# Patient Record
Sex: Male | Born: 2000 | Race: Black or African American | Hispanic: No | Marital: Single | Smoking: Never smoker
Health system: Southern US, Community
[De-identification: ages and names within clinical notes are randomized; demographics above are authoritative.]

## PROBLEM LIST (undated history)

## (undated) DIAGNOSIS — L309 Dermatitis, unspecified: Secondary | ICD-10-CM

---
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
## 2020-01-03 ENCOUNTER — Emergency Department (HOSPITAL_BASED_OUTPATIENT_CLINIC_OR_DEPARTMENT_OTHER): Payer: Medicaid Other

## 2020-01-03 ENCOUNTER — Encounter (HOSPITAL_BASED_OUTPATIENT_CLINIC_OR_DEPARTMENT_OTHER): Payer: Self-pay | Admitting: Emergency Medicine

## 2020-01-03 ENCOUNTER — Emergency Department (HOSPITAL_BASED_OUTPATIENT_CLINIC_OR_DEPARTMENT_OTHER)
Admission: EM | Admit: 2020-01-03 | Discharge: 2020-01-03 | Disposition: A | Discharge status: Home / Self Care | Payer: Medicaid Other | Attending: Emergency Medicine | Admitting: Emergency Medicine

## 2020-01-03 DIAGNOSIS — R0789 Other chest pain: Secondary | ICD-10-CM | POA: Insufficient documentation

## 2020-01-03 DIAGNOSIS — R079 Chest pain, unspecified: Secondary | ICD-10-CM

## 2020-01-03 LAB — COMPREHENSIVE METABOLIC PANEL
ALT: 11 U/L (ref 0–44)
AST: 14 U/L — ABNORMAL LOW (ref 15–41)
Albumin: 4.7 g/dL (ref 3.5–5.0)
Alkaline Phosphatase: 78 U/L (ref 38–126)
Anion gap: 9 (ref 5–15)
BUN: 19 mg/dL (ref 6–20)
CO2: 25 mmol/L (ref 22–32)
Calcium: 9.6 mg/dL (ref 8.9–10.3)
Chloride: 101 mmol/L (ref 98–111)
Creatinine, Ser: 0.74 mg/dL (ref 0.61–1.24)
GFR, Estimated: >60 mL/min (ref >60)
Glucose, Bld: 89 mg/dL (ref 70–99)
Potassium: 3.5 mmol/L (ref 3.5–5.1)
Sodium: 135 mmol/L (ref 135–145)
Total Bilirubin: 0.8 mg/dL (ref 0.3–1.2)
Total Protein: 7.9 g/dL (ref 6.5–8.1)

## 2020-01-03 LAB — CBC
HCT: 45 % (ref 39.0–52.0)
Hemoglobin: 15.6 g/dL (ref 13.0–17.0)
MCH: 31.3 pg (ref 26.0–34.0)
MCHC: 34.7 g/dL (ref 30.0–36.0)
MCV: 90.2 fL (ref 80.0–100.0)
Platelets: 221 10*3/uL (ref 150–400)
RBC: 4.99 MIL/uL (ref 4.22–5.81)
RDW: 12.6 % (ref 11.5–15.5)
WBC: 5 10*3/uL (ref 4.0–10.5)
nRBC: 0 % (ref 0.0–0.2)

## 2020-01-03 LAB — LIPASE, BLOOD: Lipase: 20 U/L (ref 11–51)

## 2020-01-03 LAB — D-DIMER, QUANTITATIVE: D-Dimer, Quant: <0.27 ug/mL-FEU (ref 0.00–0.50)

## 2020-01-03 LAB — TROPONIN I (HIGH SENSITIVITY): Troponin I (High Sensitivity): <2 ng/L (ref <18)

## 2020-01-03 LAB — CBG MONITORING, ED: Glucose-Capillary: 83 mg/dL (ref 70–99)

## 2020-01-03 MED ORDER — IOHEXOL 350 MG/ML SOLN
100.0000 mL | Freq: Once | INTRAVENOUS | Status: AC | PRN
  Administered 2020-01-03: 12:00:00 100 mL via INTRAVENOUS

## 2020-01-03 MED ORDER — NAPROXEN 375 MG PO TABS: 375.0000 mg | ORAL_TABLET | Freq: Two times a day (BID) | ORAL | 0 refills | Status: AC

## 2020-01-03 MED ORDER — MORPHINE SULFATE (PF) 4 MG/ML IV SOLN
4.0000 mg | Freq: Once | INTRAVENOUS | Status: AC
  Administered 2020-01-03: 10:00:00 4 mg via INTRAVENOUS
  Filled 2020-01-03: qty 1

## 2020-01-03 NOTE — ED Provider Notes (Signed)
[facility] EMERGENCY DEPARTMENT Provider Note   CSN: [id number] Arrival date & time: 01/03/20  3419     History Chief Complaint  Patient presents with  . Chest Pain    Deray Dawes is a 19 y.o. male.  HPI  HPI: A 19 year old patient presents for evaluation of chest pain. Initial onset of pain was more than 6 hours ago. The patient's chest pain is well-localized, is sharp and is not worse with exertion. The patient's chest pain is middle- or left-sided, is not described as heaviness/pressure/tightness and does not radiate to the arms/jaw/neck. The patient does not complain of nausea and denies diaphoresis. The patient has no history of stroke, has no history of peripheral artery disease, has not smoked in the past 90 days, denies any history of treated diabetes, has no relevant family history of coronary artery disease (first degree relative at less than age 12), is not hypertensive, has no history of hypercholesterolemia and does not have an elevated BMI (>=30).   Patient states symptoms have been ongoing for the past week.  He was seen at [facility] for the same issue couple days ago.  Patient states he was not given any prescriptions and was instructed to follow-up with his primary care doctor.  He was told that everything checked out okay.  Pain has persisted.  He came to the ED because he is not feeling any better.  Past Medical History:  Diagnosis Date  . Eczema     There are no problems to display for this patient.   History reviewed. No pertinent surgical history.     No family history on file.  Social History   Tobacco Use  . Smoking status: Never Smoker  . Smokeless tobacco: Never Used  Substance Use Topics  . Alcohol use: No  . Drug use: No    Home Medications Prior to Admission medications   Medication Sig Start Date End Date Taking? Authorizing Provider  naproxen (NAPROSYN) 375 MG tablet Take 1 tablet (375 mg total) by mouth 2 (two)  times daily. 01/03/20   Linwood Dibbles, MD  pimecrolimus (ELIDEL) 1 % cream Apply topically 2 (two) times daily.    [provider]    Allergies    Patient has no known allergies.  Review of Systems   Review of Systems  All other systems reviewed and are negative.   Physical Exam Updated Vital Signs BP 111/65   Pulse 76   Temp 98.3 F (36.8 C) (Oral)   Resp 17   Ht 1.778 m (5\' 10" )   Wt 59 kg   SpO2 100%   BMI 18.65 kg/m   Physical Exam Vitals and nursing note reviewed.  Constitutional:      General: He is not in acute distress.    Appearance: He is well-developed and well-nourished.     Comments: Thin, appears to be in pain  HENT:     Head: Normocephalic and atraumatic.     Right Ear: External ear normal.     Left Ear: External ear normal.  Eyes:     General: No scleral icterus.       Right eye: No discharge.        Left eye: No discharge.     Conjunctiva/sclera: Conjunctivae normal.  Neck:     Trachea: No tracheal deviation.  Cardiovascular:     Rate and Rhythm: Normal rate and regular rhythm.     Pulses: Intact distal pulses.  Pulmonary:  Effort: Pulmonary effort is normal. No respiratory distress.     Breath sounds: Normal breath sounds. No stridor. No wheezing or rales.  Abdominal:     General: Bowel sounds are normal. There is no distension.     Palpations: Abdomen is soft.     Tenderness: There is no abdominal tenderness. There is no guarding or rebound.  Musculoskeletal:        General: No tenderness or edema.     Cervical back: Neck supple.  Skin:    General: Skin is warm and dry.     Findings: No rash.  Neurological:     Mental Status: He is alert.     Cranial Nerves: No cranial nerve deficit (no facial droop, extraocular movements intact, no slurred speech).     Sensory: No sensory deficit.     Motor: No abnormal muscle tone or seizure activity.     Coordination: Coordination normal.     Deep Tendon Reflexes: Strength normal.   Psychiatric:        Mood and Affect: Mood and affect normal.     ED Results / Procedures / Treatments   Labs (all labs ordered are listed, but only abnormal results are displayed) Labs Reviewed  COMPREHENSIVE METABOLIC PANEL - Abnormal; Notable for the following components:      Result Value   AST 14 (*)    All other components within normal limits  CBC  D-DIMER, QUANTITATIVE (NOT AT [facility])  LIPASE, BLOOD  CBG MONITORING, ED  TROPONIN I (HIGH SENSITIVITY)    EKG EKG Interpretation  Date/Time:  Saturday January 03 2020 09:23:21 EST Ventricular Rate:  82 PR Interval:    QRS Duration: 88 QT Interval:  339 QTC Calculation: 396 R Axis:   48 Text Interpretation: Sinus rhythm Borderline low voltage, extremity leads Nonspecific T abnormalities, lateral leads No old tracing to compare Confirmed by Linwood Dibbles [phone]) on 01/03/2020 9:26:53 AM Also confirmed by Linwood Dibbles [phone]), editor Elita Quick [phone])  on 01/03/2020 12:35:06 PM   Radiology DG Chest Portable 1 View  Result Date: 01/03/2020 CLINICAL DATA:  19 year old male with chest pain. EXAM: PORTABLE CHEST 1 VIEW COMPARISON:  12/30/2019 FINDINGS: The heart size and mediastinal contours are within normal limits. Both lungs are clear. The visualized skeletal structures are unremarkable. IMPRESSION: No acute cardiopulmonary process. Electronically Signed   By: Marliss Coots MD   On: 01/03/2020 10:34   CT ANGIO CHEST AORTA W/CM & OR WO/CM  Result Date: 01/03/2020 CLINICAL DATA:  19 year old male with history of left-sided chest pain for 1 week. Evaluate for nontraumatic aortic disease. EXAM: CT ANGIOGRAPHY CHEST WITH CONTRAST TECHNIQUE: Multidetector CT imaging of the chest was performed using the standard protocol during bolus administration of intravenous contrast. Multiplanar CT image reconstructions and MIPs were obtained to evaluate the vascular anatomy. CONTRAST:  OMNIPAQUE IOHEXOL 350 MG/ML SOLN COMPARISON:   None. FINDINGS: Cardiovascular: Heart size is mildly enlarged. There is no significant pericardial fluid, thickening or pericardial calcification. No crescentic high attenuation associated with the wall of the thoracic aorta on noncontrast images to suggest acute intramural hemorrhage. No significant atherosclerotic disease, aneurysm or dissection noted in the thoracic aorta. No calcifications in the coronary arteries. Mediastinum/Nodes: No pathologically enlarged mediastinal or hilar lymph nodes. Esophagus is unremarkable in appearance. No axillary lymphadenopathy. Lungs/Pleura: No pneumothorax. No acute consolidative airspace disease. No pleural effusions. No suspicious appearing pulmonary nodules or masses are noted. Upper Abdomen: Unremarkable. Musculoskeletal: There are no aggressive appearing lytic or blastic  lesions noted in the visualized portions of the skeleton. Review of the MIP images confirms the above findings. IMPRESSION: 1. No acute findings are noted in the thorax. Specifically, no evidence of acute aortic syndrome. Electronically Signed   By: Trudie Reed M.D.   On: 01/03/2020 12:28    Procedures Procedures (including critical care time)  Medications Ordered in ED Medications  morphine 4 MG/ML injection 4 mg (4 mg Intravenous Given 01/03/20 1022)  iohexol (OMNIPAQUE) 350 MG/ML injection 100 mL (100 mLs Intravenous Contrast Given 01/03/20 1157)    ED Course  I have reviewed the triage vital signs and the nursing notes.  Pertinent labs & imaging results that were available during my care of the patient were reviewed by me and considered in my medical decision making (see chart for details).  Clinical Course as of 01/03/20 1307  Sat Jan 03, 2020  0926 EKG 12-Lead [JK]  1118 Lab tests reviewed.  D-dimer troponin lipase CBC metabolic panel all normal [JK]  1119 Chest x-ray without acute findings [JK]  1130 Discussed test results with patient and mother.  I am concerned about  the possibility of aortic dissection primarily based on his persistent symptoms and morphology.  No known history of Marfan's disease but patient is very tall and thin.  We will proceed with CT angio [JK]    Clinical Course User Index [JK] Linwood Dibbles, MD   MDM Rules/Calculators/A&P HEAR Score: 1                        Patient presented to the ED for evaluation of chest pain.  Patient has been having symptoms for the past week.  Mom states he did have some improvement although it did not resolve after the ED visit the other day.  Pain however persisted and that is why he returned here.  Patient's ED work-up is reassuring.  Cardiac enzymes are normal.  His low risk for heart disease.  I doubt ACS.  Patient's D-dimer is negative.  I doubt PE.  I was concerned about the possibility of aortic dissection based on the persistent pain in the patient's physical appearance.  CT scan does not show any acute findings.  Not clearly having symptoms suggestive of esophageal etiology.  Symptoms could be related possibly to pleurisy or pericarditis.  Will discharge home with NSAIDs.  Patient did have an echocardiogram this morning although I do not have the results yet.  I think is reasonable for the patient to follow-up with his primary care doctor for further treatment and evaluation. Final Clinical Impression(s) / ED Diagnoses Final diagnoses:  Chest pain, unspecified type    Rx / DC Orders ED Discharge Orders         Ordered    naproxen (NAPROSYN) 375 MG tablet  2 times daily        01/03/20 1305           Linwood Dibbles, MD 01/03/20 1308

## 2020-01-03 NOTE — Discharge Instructions (Signed)
Take medications as needed for pain.  Follow-up with your doctor for further evaluation as we discussed

## 2020-01-03 NOTE — ED Notes (Signed)
CBG 83 

## 2020-01-03 NOTE — ED Notes (Signed)
ED Provider at bedside. 

## 2020-01-03 NOTE — ED Triage Notes (Signed)
L side chest pain x 1 week. Was seen at [facility] this week. Pain persists. Denies recent illness.

## 2020-01-03 NOTE — ED Notes (Signed)
PCXR at bedside.

## 2021-12-14 IMAGING — DX DG CHEST 1V PORT
1 series · 1 of 1 positions shown · non-contrast
Comparison: 12/30/2019

CLINICAL DATA: 19-year-old male with chest pain.

EXAM:
PORTABLE CHEST 1 VIEW

[chest ap]
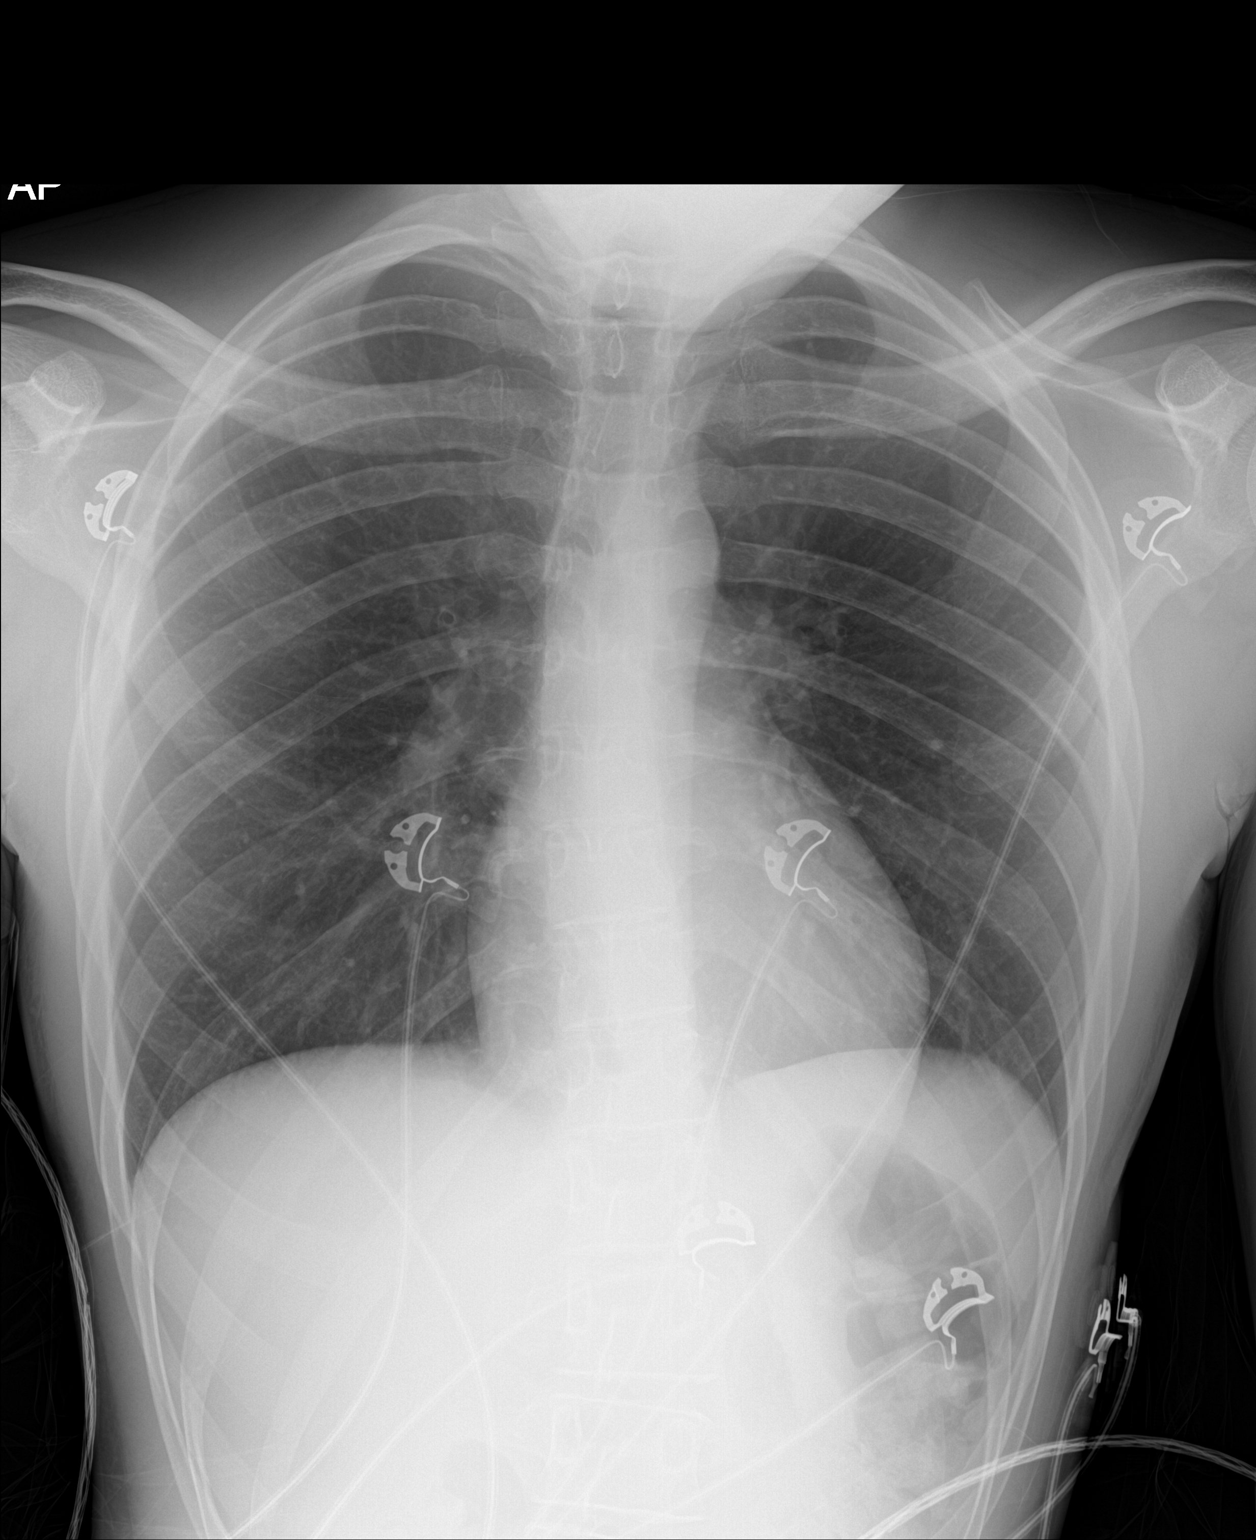

[1 of 1 positions shown; findings below may reference images not displayed]

FINDINGS: The heart size and mediastinal contours are within normal limits.
Both lungs are clear. The visualized skeletal structures are
unremarkable.
IMPRESSION: No acute cardiopulmonary process.

## 2021-12-14 IMAGING — CT CT ANGIO CHEST
3 of 9 series · 18 of 46 positions shown · IV contrast (Omnipaque)
Comparison: None.

CLINICAL DATA: 19-year-old male with history of left-sided chest
pain for 1 week. Evaluate for nontraumatic aortic disease.

EXAM:
CT ANGIOGRAPHY CHEST WITH CONTRAST
TECHNIQUE: Multidetector CT imaging of the chest was performed using the
standard protocol during bolus administration of intravenous
contrast. Multiplanar CT image reconstructions and MIPs were
obtained to evaluate the vascular anatomy.
CONTRAST:  100mL OMNIPAQUE IOHEXOL 350 MG/ML SOLN

[Series 5: axial arterial · axial · arterial · 0.68mm/px · z∈[-99,+144]mm · 12 of 97 slices shown]
[im 8/97  lung]
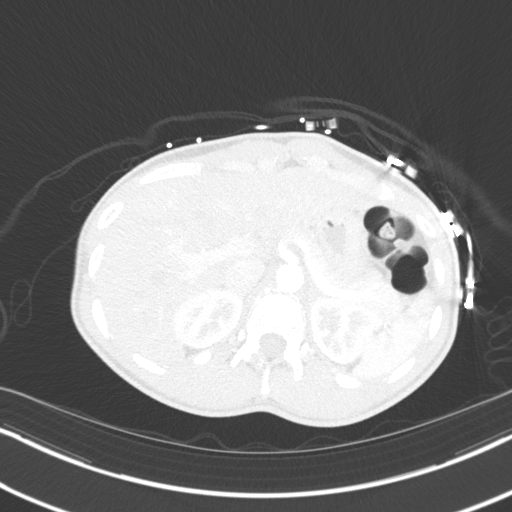
[im 15/97  soft-tissue]
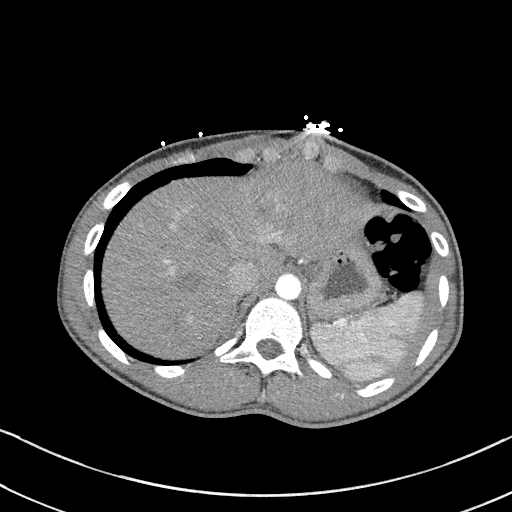
[im 23/97  lung]
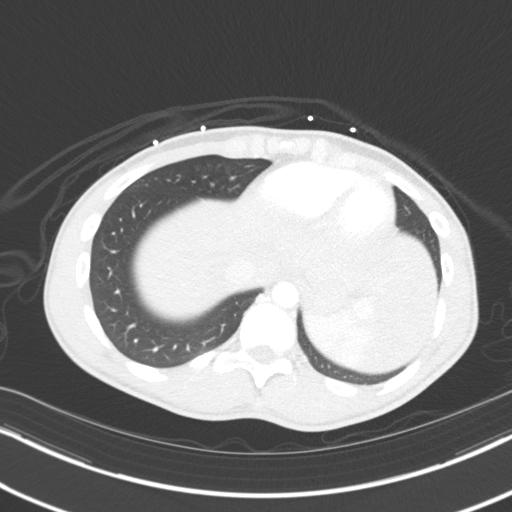
[im 30/97  soft-tissue]
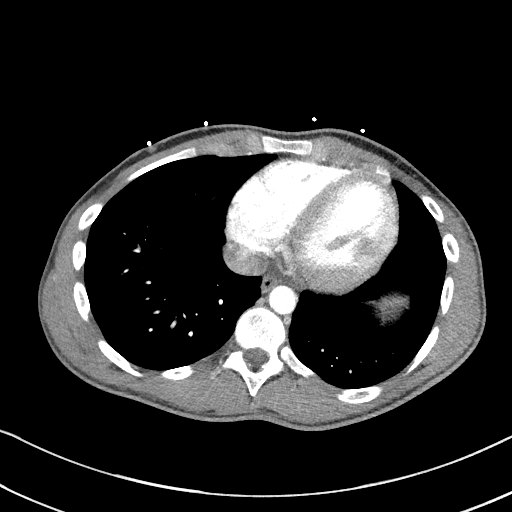
[im 37/97  lung]
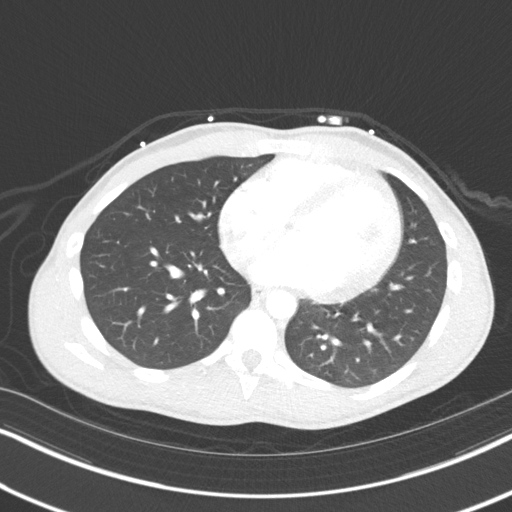
[im 45/97  soft-tissue]
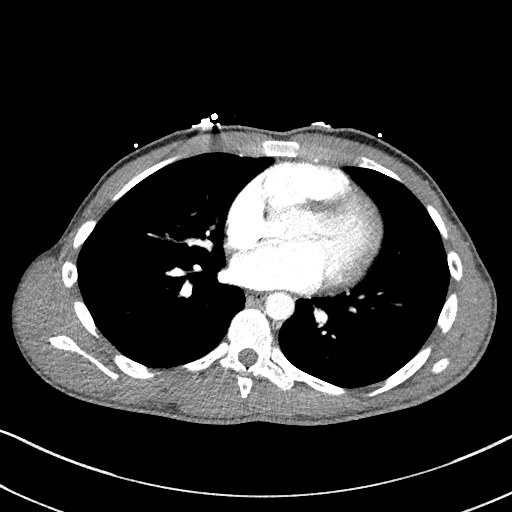
[im 52/97  lung]
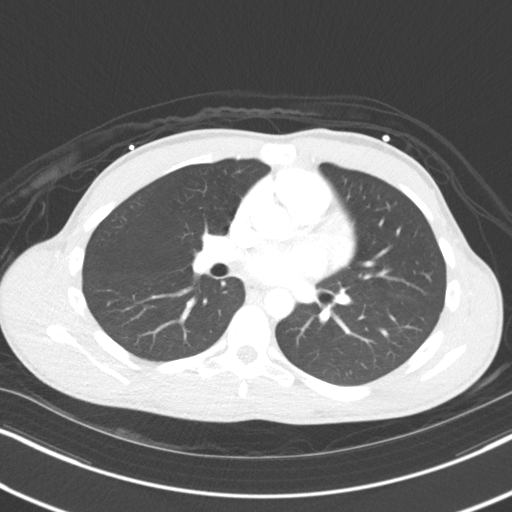
[im 60/97  soft-tissue]
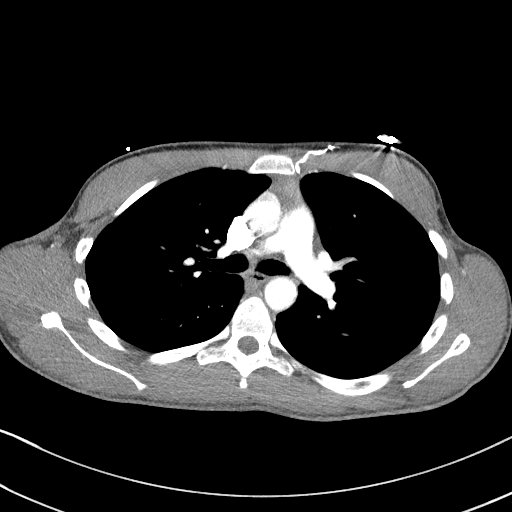
[im 67/97  lung]
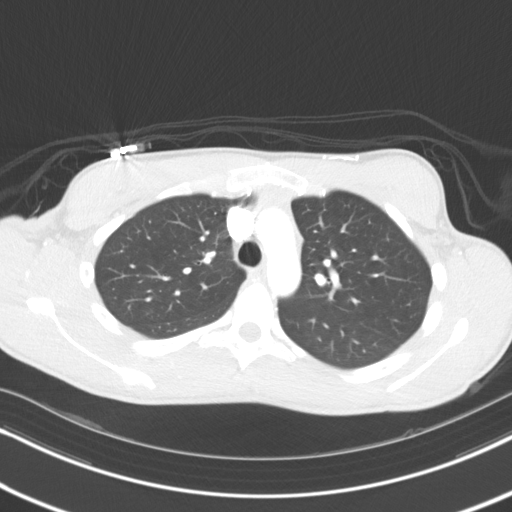
[im 74/97  soft-tissue]
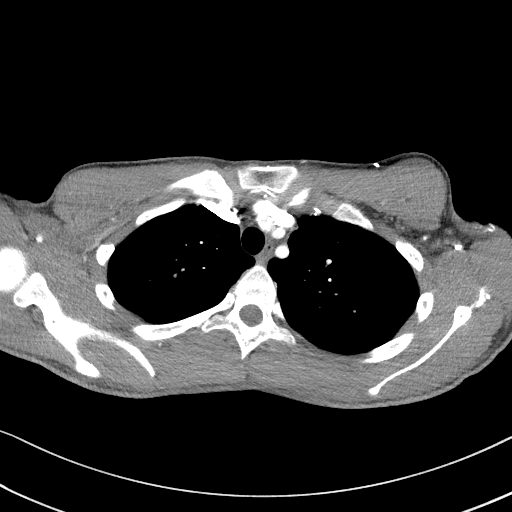
[im 82/97  lung]
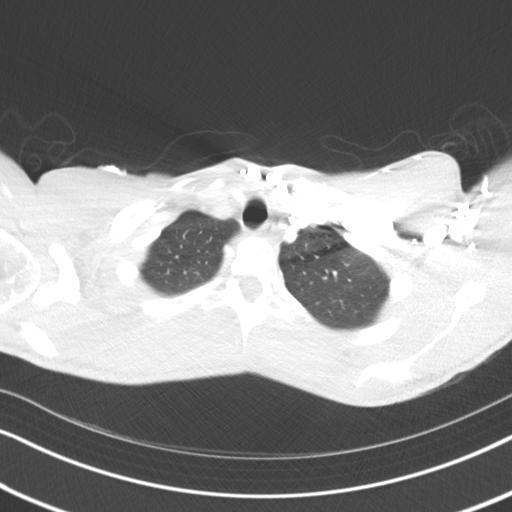
[im 89/97  soft-tissue]
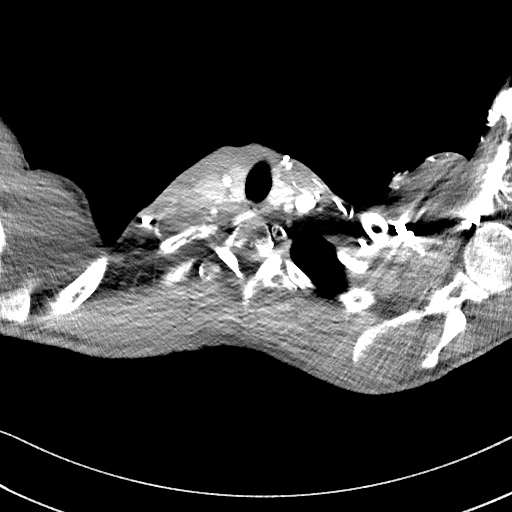

[Series 6: lung · axial · 0.68mm/px · z∈[-82,-2]mm · 3 of 59 slices shown]
[im 9/59  soft-tissue]
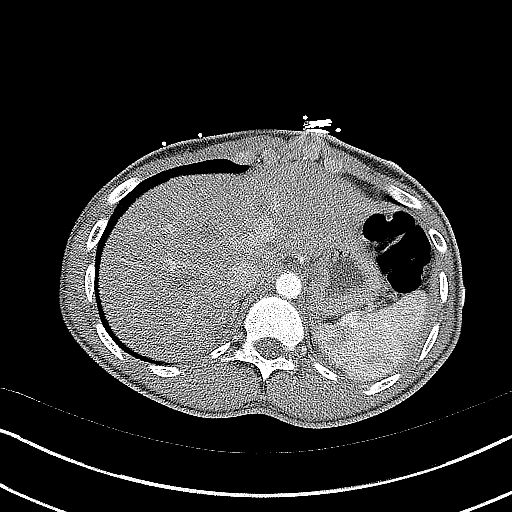
[im 17/59  soft-tissue]
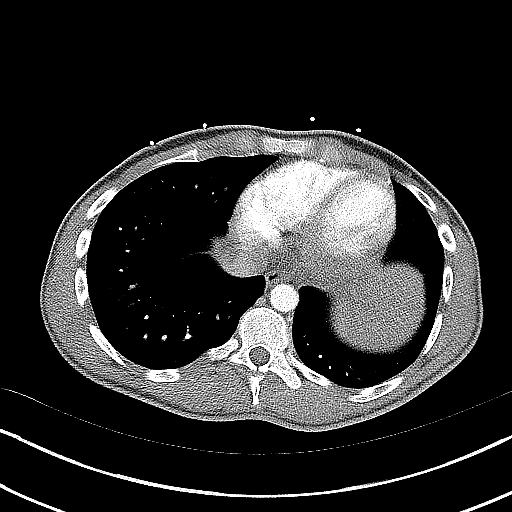
[im 25/59  soft-tissue]
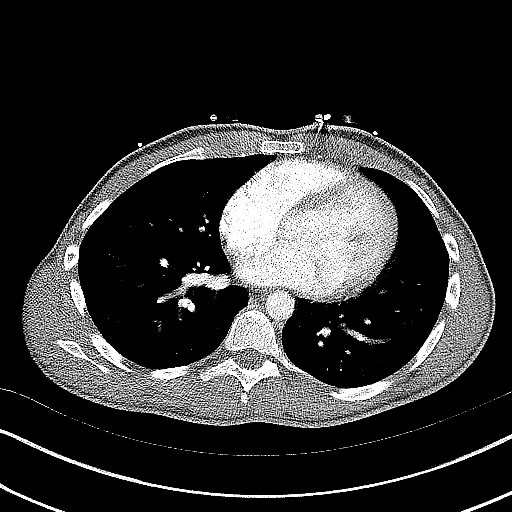

[Series 8: coronals · coronal · 0.59mm/px · 3 of 109 slices shown]
[im 28/109  soft-tissue]
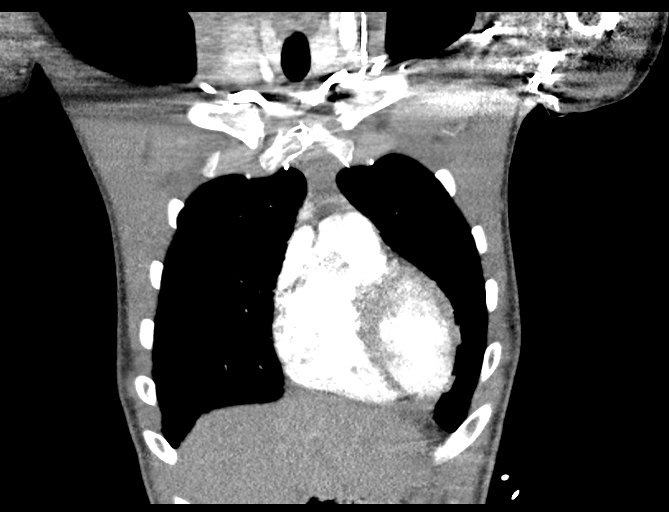
[im 55/109  soft-tissue]
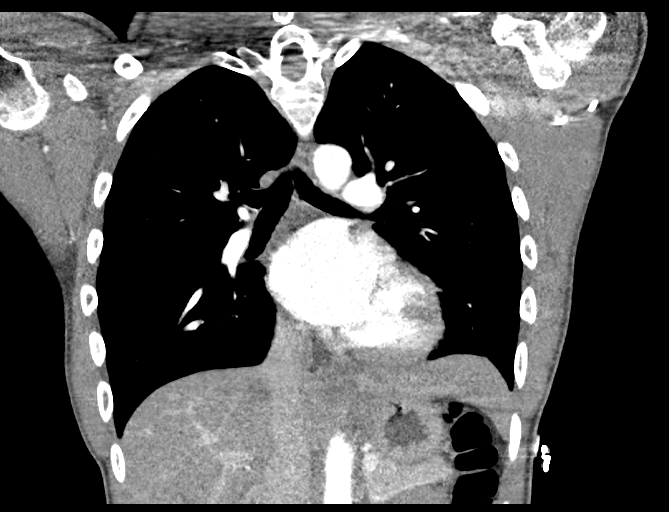
[im 82/109  soft-tissue]
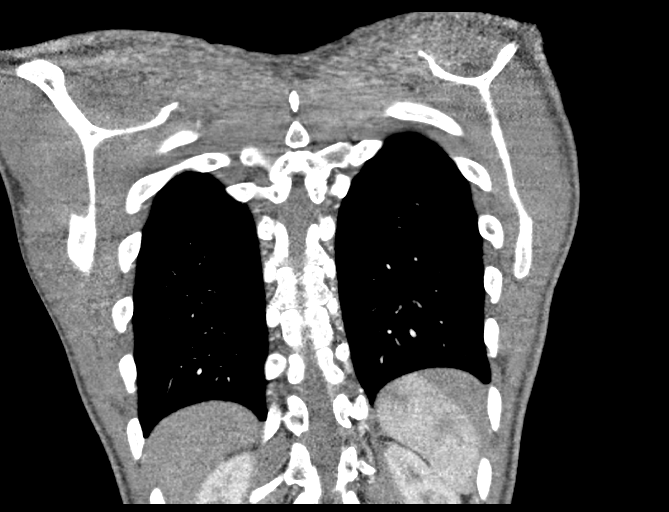

[18 of 46 positions shown; findings below may reference images not displayed]

FINDINGS: Cardiovascular: Heart size is mildly enlarged. There is no
significant pericardial fluid, thickening or pericardial
calcification. No crescentic high attenuation associated with the
wall of the thoracic aorta on noncontrast images to suggest acute
intramural hemorrhage. No significant atherosclerotic disease,
aneurysm or dissection noted in the thoracic aorta. No
calcifications in the coronary arteries.

Mediastinum/Nodes: No pathologically enlarged mediastinal or hilar
lymph nodes. Esophagus is unremarkable in appearance. No axillary
lymphadenopathy.

Lungs/Pleura: No pneumothorax. No acute consolidative airspace
disease. No pleural effusions. No suspicious appearing pulmonary
nodules or masses are noted.

Upper Abdomen: Unremarkable.

Musculoskeletal: There are no aggressive appearing lytic or blastic
lesions noted in the visualized portions of the skeleton.

Review of the MIP images confirms the above findings.
IMPRESSION: 1. No acute findings are noted in the thorax. Specifically, no
evidence of acute aortic syndrome.
